# Patient Record
Sex: Male | Born: 1950
Health system: Southern US, Community
[De-identification: ages and names within clinical notes are randomized; demographics above are authoritative.]

---

## 2003-09-08 ENCOUNTER — Ambulatory Visit (HOSPITAL_COMMUNITY): Admission: RE | Admit: 2003-09-08 | Discharge: 2003-09-11 | Payer: Self-pay | Admitting: *Deleted

## 2004-03-05 ENCOUNTER — Ambulatory Visit: Payer: Self-pay | Admitting: Family Medicine

## 2004-03-12 ENCOUNTER — Ambulatory Visit: Payer: Self-pay | Admitting: Family Medicine

## 2004-09-02 ENCOUNTER — Ambulatory Visit: Payer: Self-pay | Admitting: Family Medicine

## 2004-09-05 ENCOUNTER — Ambulatory Visit: Payer: Self-pay | Admitting: Family Medicine

## 2004-12-12 ENCOUNTER — Ambulatory Visit: Payer: Self-pay | Admitting: Family Medicine

## 2005-01-03 ENCOUNTER — Ambulatory Visit: Payer: Self-pay | Admitting: Family Medicine

## 2005-03-03 ENCOUNTER — Ambulatory Visit: Payer: Self-pay | Admitting: Family Medicine

## 2005-07-10 ENCOUNTER — Ambulatory Visit: Payer: Self-pay | Admitting: Family Medicine

## 2005-07-14 IMAGING — CR DG CHEST 2V
2 series · 2 of 2 positions shown · non-contrast
Comparison: none

CLINICAL DATA: Pre-cardiac cath.
 CHEST 
 Two views of the chest show the lungs to be clear.  There is mild cardiomegaly present.  No bony abnormality is seen.
 IMPRESSION
 Mild cardiomegaly.  No active lung disease.

[view not recorded (1 of 2)]
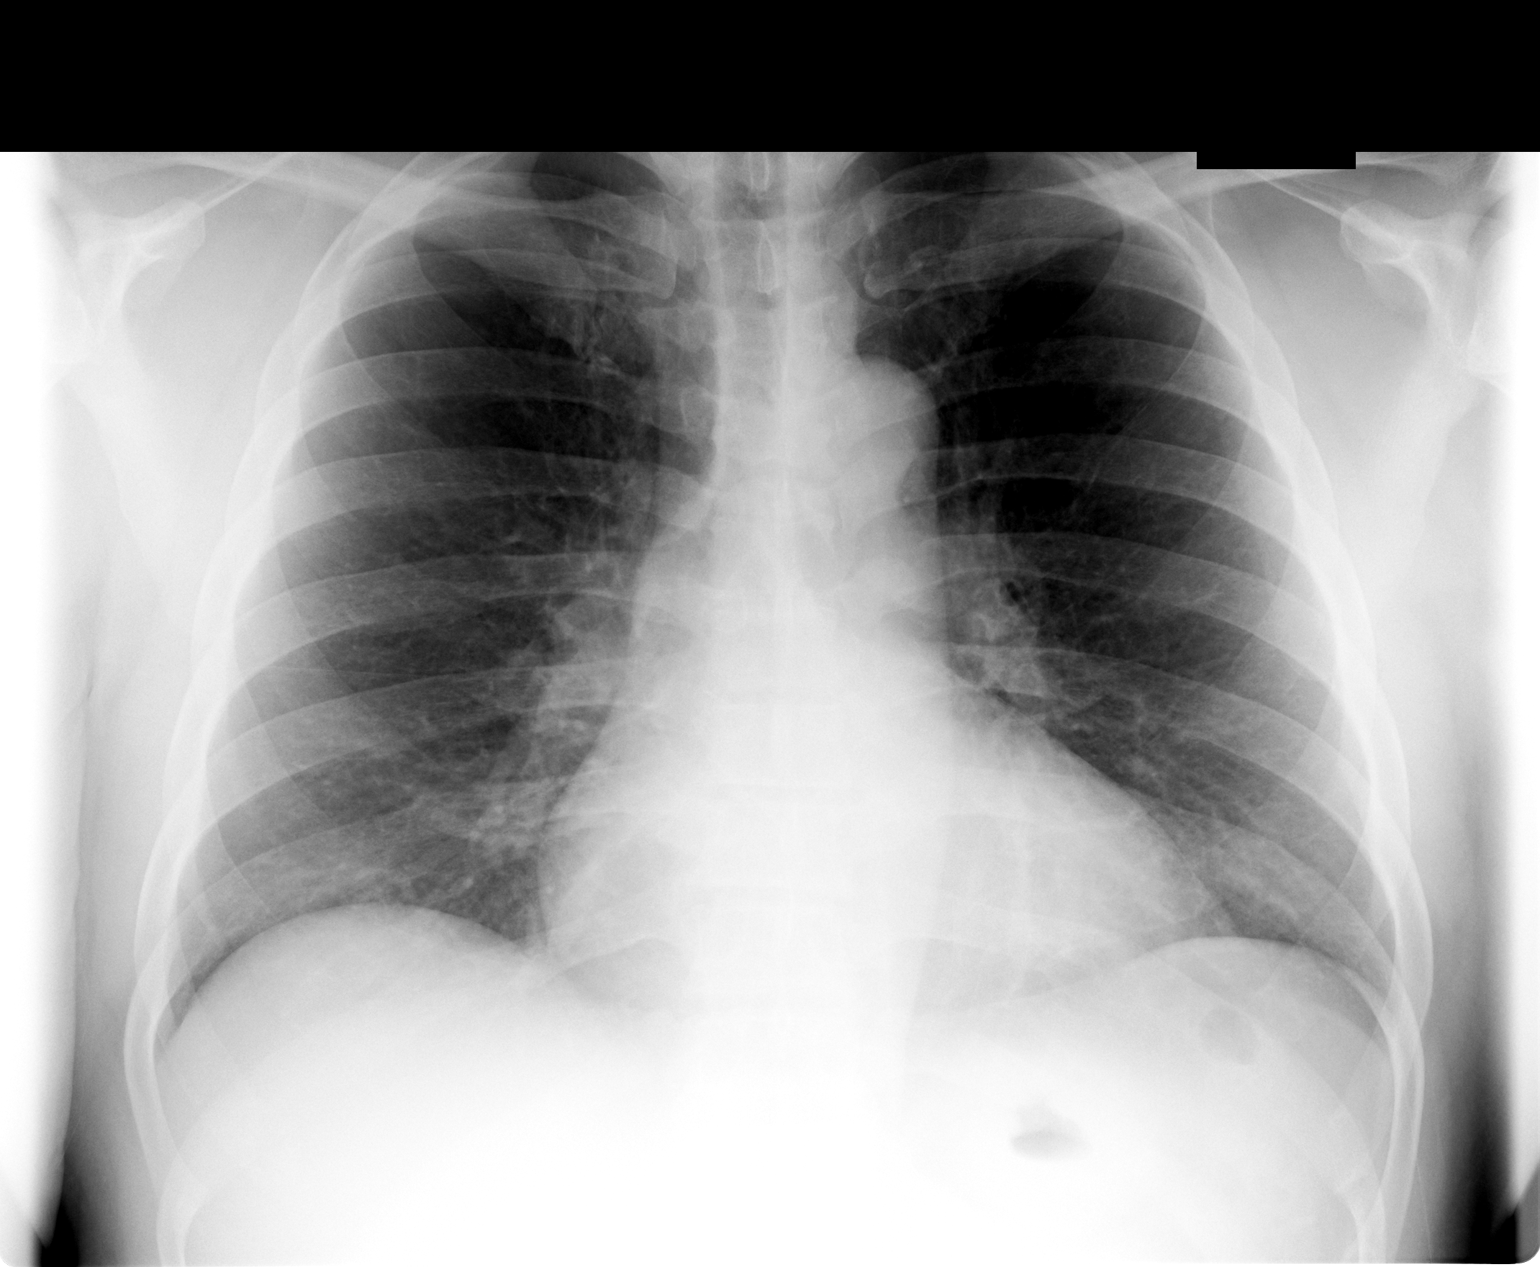

[view not recorded (2 of 2)]
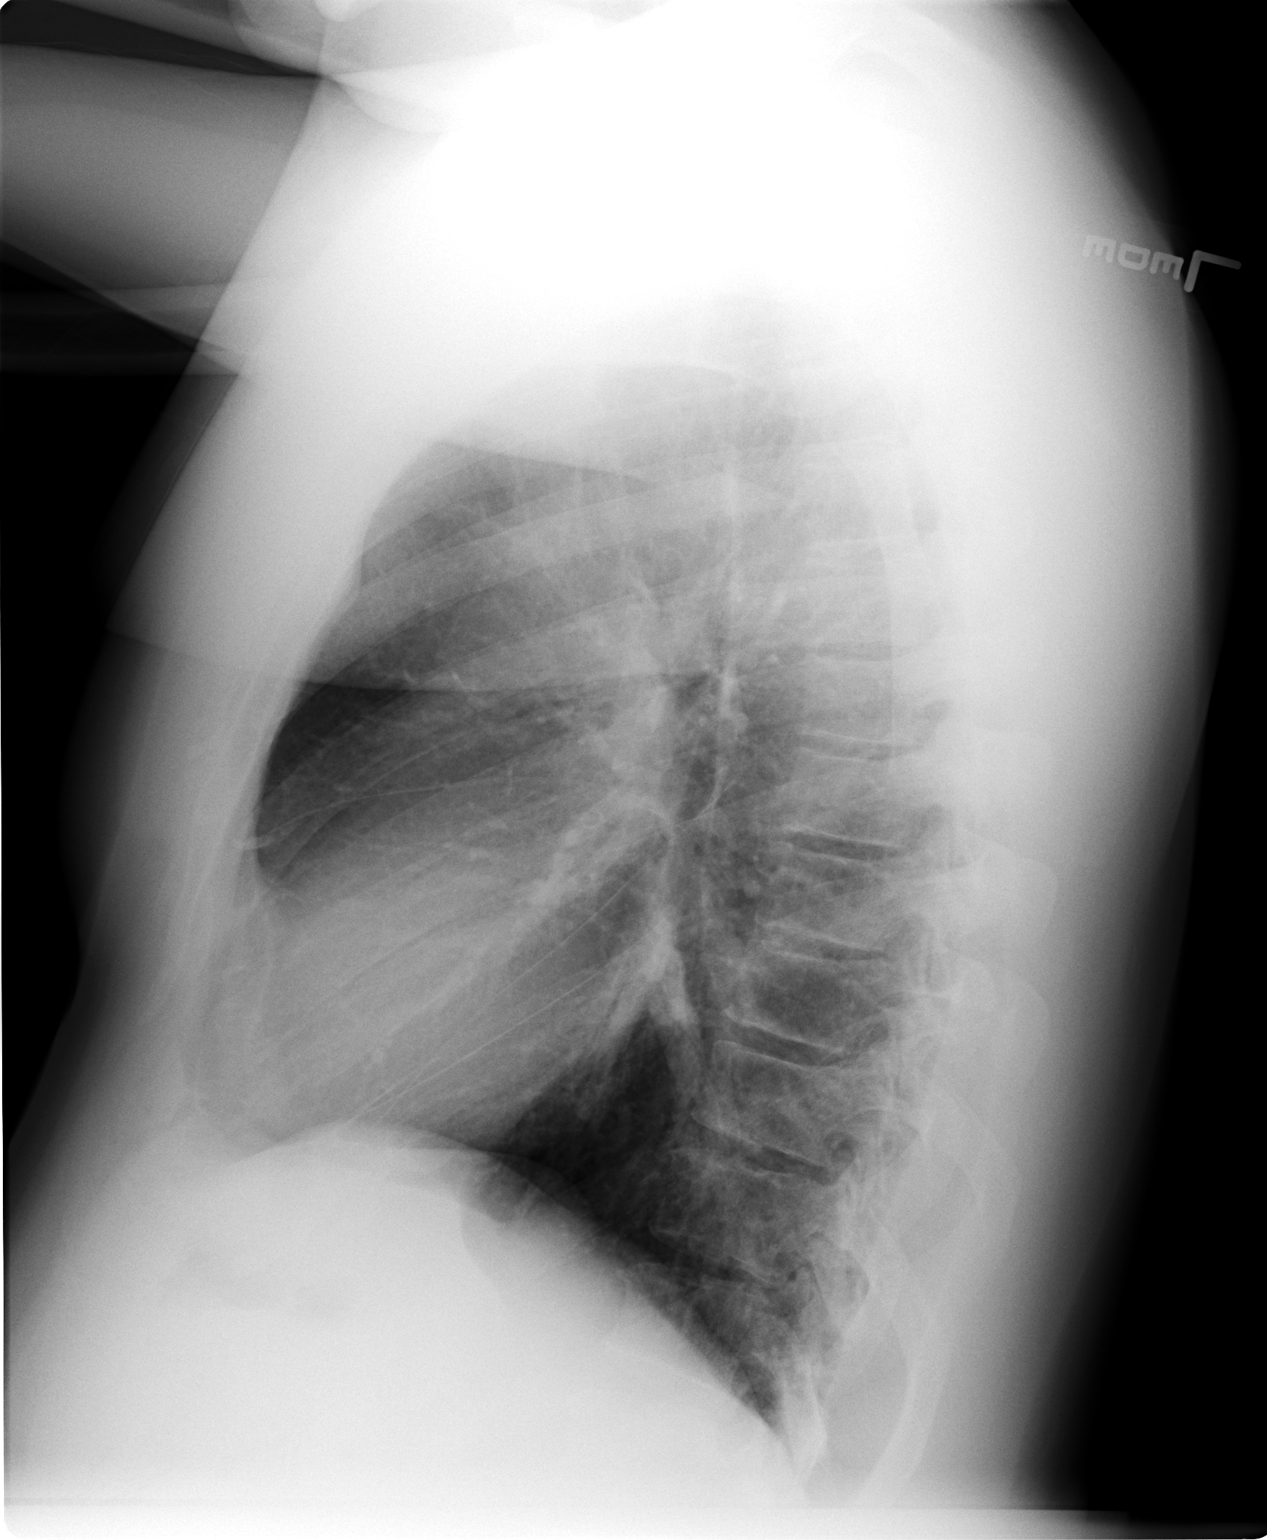

[2 of 2 positions shown; findings below may reference images not displayed]

## 2015-05-11 DIAGNOSIS — I70202 Unspecified atherosclerosis of native arteries of extremities, left leg: Secondary | ICD-10-CM | POA: Diagnosis not present

## 2015-05-18 DIAGNOSIS — E1159 Type 2 diabetes mellitus with other circulatory complications: Secondary | ICD-10-CM | POA: Diagnosis not present

## 2015-05-18 DIAGNOSIS — I739 Peripheral vascular disease, unspecified: Secondary | ICD-10-CM | POA: Diagnosis not present

## 2015-05-18 DIAGNOSIS — I70202 Unspecified atherosclerosis of native arteries of extremities, left leg: Secondary | ICD-10-CM | POA: Diagnosis not present

## 2015-06-08 DIAGNOSIS — E291 Testicular hypofunction: Secondary | ICD-10-CM | POA: Diagnosis not present

## 2015-06-08 DIAGNOSIS — Z125 Encounter for screening for malignant neoplasm of prostate: Secondary | ICD-10-CM | POA: Diagnosis not present

## 2015-06-15 DIAGNOSIS — E291 Testicular hypofunction: Secondary | ICD-10-CM | POA: Diagnosis not present

## 2015-09-26 DIAGNOSIS — R7989 Other specified abnormal findings of blood chemistry: Secondary | ICD-10-CM | POA: Diagnosis not present

## 2015-09-26 DIAGNOSIS — E1159 Type 2 diabetes mellitus with other circulatory complications: Secondary | ICD-10-CM | POA: Diagnosis not present

## 2015-09-26 DIAGNOSIS — E785 Hyperlipidemia, unspecified: Secondary | ICD-10-CM | POA: Diagnosis not present

## 2015-10-09 DIAGNOSIS — I10 Essential (primary) hypertension: Secondary | ICD-10-CM | POA: Diagnosis not present

## 2015-10-09 DIAGNOSIS — K210 Gastro-esophageal reflux disease with esophagitis: Secondary | ICD-10-CM | POA: Diagnosis not present

## 2015-10-09 DIAGNOSIS — N529 Male erectile dysfunction, unspecified: Secondary | ICD-10-CM | POA: Diagnosis not present

## 2015-10-09 DIAGNOSIS — R945 Abnormal results of liver function studies: Secondary | ICD-10-CM | POA: Diagnosis not present

## 2015-10-09 DIAGNOSIS — E1159 Type 2 diabetes mellitus with other circulatory complications: Secondary | ICD-10-CM | POA: Diagnosis not present

## 2015-10-09 DIAGNOSIS — E785 Hyperlipidemia, unspecified: Secondary | ICD-10-CM | POA: Diagnosis not present

## 2015-12-03 DIAGNOSIS — M7751 Other enthesopathy of right foot: Secondary | ICD-10-CM | POA: Diagnosis not present

## 2016-02-18 DIAGNOSIS — E1142 Type 2 diabetes mellitus with diabetic polyneuropathy: Secondary | ICD-10-CM | POA: Diagnosis not present

## 2016-02-18 DIAGNOSIS — M2040 Other hammer toe(s) (acquired), unspecified foot: Secondary | ICD-10-CM | POA: Diagnosis not present

## 2016-02-20 DIAGNOSIS — Z23 Encounter for immunization: Secondary | ICD-10-CM | POA: Diagnosis not present

## 2016-03-04 DIAGNOSIS — H31011 Macula scars of posterior pole (postinflammatory) (post-traumatic), right eye: Secondary | ICD-10-CM | POA: Diagnosis not present

## 2016-03-04 DIAGNOSIS — H524 Presbyopia: Secondary | ICD-10-CM | POA: Diagnosis not present

## 2016-03-04 DIAGNOSIS — H52203 Unspecified astigmatism, bilateral: Secondary | ICD-10-CM | POA: Diagnosis not present

## 2016-03-04 DIAGNOSIS — H43813 Vitreous degeneration, bilateral: Secondary | ICD-10-CM | POA: Diagnosis not present

## 2016-03-04 DIAGNOSIS — H2513 Age-related nuclear cataract, bilateral: Secondary | ICD-10-CM | POA: Diagnosis not present

## 2016-03-04 DIAGNOSIS — E119 Type 2 diabetes mellitus without complications: Secondary | ICD-10-CM | POA: Diagnosis not present

## 2016-03-04 DIAGNOSIS — H5203 Hypermetropia, bilateral: Secondary | ICD-10-CM | POA: Diagnosis not present

## 2016-03-14 DIAGNOSIS — R55 Syncope and collapse: Secondary | ICD-10-CM | POA: Diagnosis not present

## 2016-03-14 DIAGNOSIS — R404 Transient alteration of awareness: Secondary | ICD-10-CM | POA: Diagnosis not present

## 2016-03-14 DIAGNOSIS — Z7289 Other problems related to lifestyle: Secondary | ICD-10-CM | POA: Diagnosis not present

## 2016-03-14 DIAGNOSIS — E119 Type 2 diabetes mellitus without complications: Secondary | ICD-10-CM | POA: Diagnosis not present

## 2016-03-14 DIAGNOSIS — I10 Essential (primary) hypertension: Secondary | ICD-10-CM | POA: Diagnosis not present

## 2016-03-14 DIAGNOSIS — W19XXXA Unspecified fall, initial encounter: Secondary | ICD-10-CM | POA: Diagnosis not present

## 2016-03-14 DIAGNOSIS — R61 Generalized hyperhidrosis: Secondary | ICD-10-CM | POA: Diagnosis not present

## 2016-03-14 DIAGNOSIS — S00531A Contusion of lip, initial encounter: Secondary | ICD-10-CM | POA: Diagnosis not present

## 2016-03-14 DIAGNOSIS — R531 Weakness: Secondary | ICD-10-CM | POA: Diagnosis not present

## 2016-03-14 DIAGNOSIS — R6883 Chills (without fever): Secondary | ICD-10-CM | POA: Diagnosis not present

## 2016-03-24 DIAGNOSIS — E291 Testicular hypofunction: Secondary | ICD-10-CM | POA: Diagnosis not present

## 2016-03-24 DIAGNOSIS — N400 Enlarged prostate without lower urinary tract symptoms: Secondary | ICD-10-CM | POA: Diagnosis not present

## 2016-03-24 DIAGNOSIS — E349 Endocrine disorder, unspecified: Secondary | ICD-10-CM | POA: Diagnosis not present

## 2016-04-10 DIAGNOSIS — E1151 Type 2 diabetes mellitus with diabetic peripheral angiopathy without gangrene: Secondary | ICD-10-CM | POA: Diagnosis not present

## 2016-04-10 DIAGNOSIS — E1159 Type 2 diabetes mellitus with other circulatory complications: Secondary | ICD-10-CM | POA: Diagnosis not present

## 2016-04-10 DIAGNOSIS — E7801 Familial hypercholesterolemia: Secondary | ICD-10-CM | POA: Diagnosis not present

## 2016-04-10 DIAGNOSIS — I70219 Atherosclerosis of native arteries of extremities with intermittent claudication, unspecified extremity: Secondary | ICD-10-CM | POA: Diagnosis not present

## 2016-04-10 DIAGNOSIS — Z0000 Encounter for general adult medical examination without abnormal findings: Secondary | ICD-10-CM | POA: Diagnosis not present

## 2016-04-10 DIAGNOSIS — I10 Essential (primary) hypertension: Secondary | ICD-10-CM | POA: Diagnosis not present

## 2016-04-10 DIAGNOSIS — K210 Gastro-esophageal reflux disease with esophagitis: Secondary | ICD-10-CM | POA: Diagnosis not present

## 2016-04-10 DIAGNOSIS — N529 Male erectile dysfunction, unspecified: Secondary | ICD-10-CM | POA: Diagnosis not present

## 2016-04-22 DIAGNOSIS — N400 Enlarged prostate without lower urinary tract symptoms: Secondary | ICD-10-CM | POA: Diagnosis not present

## 2016-04-22 DIAGNOSIS — E291 Testicular hypofunction: Secondary | ICD-10-CM | POA: Diagnosis not present

## 2016-04-30 DIAGNOSIS — N400 Enlarged prostate without lower urinary tract symptoms: Secondary | ICD-10-CM | POA: Diagnosis not present

## 2016-05-08 DIAGNOSIS — I70202 Unspecified atherosclerosis of native arteries of extremities, left leg: Secondary | ICD-10-CM | POA: Diagnosis not present

## 2016-05-28 DIAGNOSIS — I10 Essential (primary) hypertension: Secondary | ICD-10-CM | POA: Diagnosis not present

## 2016-05-28 DIAGNOSIS — I70219 Atherosclerosis of native arteries of extremities with intermittent claudication, unspecified extremity: Secondary | ICD-10-CM | POA: Diagnosis not present

## 2016-05-28 DIAGNOSIS — E1159 Type 2 diabetes mellitus with other circulatory complications: Secondary | ICD-10-CM | POA: Diagnosis not present

## 2016-10-15 DIAGNOSIS — I10 Essential (primary) hypertension: Secondary | ICD-10-CM | POA: Diagnosis not present

## 2016-10-15 DIAGNOSIS — E7801 Familial hypercholesterolemia: Secondary | ICD-10-CM | POA: Diagnosis not present

## 2016-10-15 DIAGNOSIS — E1159 Type 2 diabetes mellitus with other circulatory complications: Secondary | ICD-10-CM | POA: Diagnosis not present

## 2016-10-21 DIAGNOSIS — E291 Testicular hypofunction: Secondary | ICD-10-CM | POA: Diagnosis not present

## 2016-10-21 DIAGNOSIS — N400 Enlarged prostate without lower urinary tract symptoms: Secondary | ICD-10-CM | POA: Diagnosis not present

## 2016-10-22 DIAGNOSIS — J3089 Other allergic rhinitis: Secondary | ICD-10-CM | POA: Diagnosis not present

## 2016-10-22 DIAGNOSIS — E7801 Familial hypercholesterolemia: Secondary | ICD-10-CM | POA: Diagnosis not present

## 2016-10-22 DIAGNOSIS — K210 Gastro-esophageal reflux disease with esophagitis: Secondary | ICD-10-CM | POA: Diagnosis not present

## 2016-10-22 DIAGNOSIS — R531 Weakness: Secondary | ICD-10-CM | POA: Diagnosis not present

## 2016-10-22 DIAGNOSIS — N529 Male erectile dysfunction, unspecified: Secondary | ICD-10-CM | POA: Diagnosis not present

## 2016-10-22 DIAGNOSIS — E1159 Type 2 diabetes mellitus with other circulatory complications: Secondary | ICD-10-CM | POA: Diagnosis not present

## 2016-10-22 DIAGNOSIS — I10 Essential (primary) hypertension: Secondary | ICD-10-CM | POA: Diagnosis not present

## 2016-10-22 DIAGNOSIS — I739 Peripheral vascular disease, unspecified: Secondary | ICD-10-CM | POA: Diagnosis not present

## 2016-10-28 DIAGNOSIS — E291 Testicular hypofunction: Secondary | ICD-10-CM | POA: Diagnosis not present

## 2016-11-07 DIAGNOSIS — R7989 Other specified abnormal findings of blood chemistry: Secondary | ICD-10-CM | POA: Diagnosis not present

## 2016-11-07 DIAGNOSIS — R748 Abnormal levels of other serum enzymes: Secondary | ICD-10-CM | POA: Diagnosis not present

## 2016-11-10 DIAGNOSIS — R7989 Other specified abnormal findings of blood chemistry: Secondary | ICD-10-CM | POA: Diagnosis not present

## 2016-11-10 DIAGNOSIS — R748 Abnormal levels of other serum enzymes: Secondary | ICD-10-CM | POA: Diagnosis not present

## 2016-11-10 DIAGNOSIS — E872 Acidosis: Secondary | ICD-10-CM | POA: Diagnosis not present

## 2016-11-10 DIAGNOSIS — R531 Weakness: Secondary | ICD-10-CM | POA: Diagnosis not present

## 2016-11-13 DIAGNOSIS — E291 Testicular hypofunction: Secondary | ICD-10-CM | POA: Diagnosis not present

## 2016-11-27 DIAGNOSIS — N179 Acute kidney failure, unspecified: Secondary | ICD-10-CM | POA: Diagnosis not present

## 2016-11-27 DIAGNOSIS — R748 Abnormal levels of other serum enzymes: Secondary | ICD-10-CM | POA: Diagnosis not present

## 2016-11-27 DIAGNOSIS — R531 Weakness: Secondary | ICD-10-CM | POA: Diagnosis not present

## 2016-11-27 DIAGNOSIS — R7989 Other specified abnormal findings of blood chemistry: Secondary | ICD-10-CM | POA: Diagnosis not present

## 2016-12-09 DIAGNOSIS — R748 Abnormal levels of other serum enzymes: Secondary | ICD-10-CM | POA: Diagnosis not present

## 2016-12-09 DIAGNOSIS — N179 Acute kidney failure, unspecified: Secondary | ICD-10-CM | POA: Diagnosis not present

## 2016-12-11 DIAGNOSIS — N179 Acute kidney failure, unspecified: Secondary | ICD-10-CM | POA: Diagnosis not present

## 2016-12-11 DIAGNOSIS — E871 Hypo-osmolality and hyponatremia: Secondary | ICD-10-CM | POA: Diagnosis not present

## 2016-12-11 DIAGNOSIS — E559 Vitamin D deficiency, unspecified: Secondary | ICD-10-CM | POA: Diagnosis not present

## 2016-12-11 DIAGNOSIS — R748 Abnormal levels of other serum enzymes: Secondary | ICD-10-CM | POA: Diagnosis not present

## 2017-01-14 DIAGNOSIS — E291 Testicular hypofunction: Secondary | ICD-10-CM | POA: Diagnosis not present

## 2017-01-15 DIAGNOSIS — N179 Acute kidney failure, unspecified: Secondary | ICD-10-CM | POA: Diagnosis not present

## 2017-01-19 DIAGNOSIS — E889 Metabolic disorder, unspecified: Secondary | ICD-10-CM | POA: Diagnosis not present

## 2017-01-19 DIAGNOSIS — I10 Essential (primary) hypertension: Secondary | ICD-10-CM | POA: Diagnosis not present

## 2017-01-19 DIAGNOSIS — E1159 Type 2 diabetes mellitus with other circulatory complications: Secondary | ICD-10-CM | POA: Diagnosis not present

## 2017-01-19 DIAGNOSIS — R748 Abnormal levels of other serum enzymes: Secondary | ICD-10-CM | POA: Diagnosis not present

## 2017-01-21 DIAGNOSIS — E291 Testicular hypofunction: Secondary | ICD-10-CM | POA: Diagnosis not present

## 2017-01-21 DIAGNOSIS — N400 Enlarged prostate without lower urinary tract symptoms: Secondary | ICD-10-CM | POA: Diagnosis not present

## 2017-01-22 DIAGNOSIS — R7989 Other specified abnormal findings of blood chemistry: Secondary | ICD-10-CM | POA: Diagnosis not present

## 2017-02-25 DIAGNOSIS — Z23 Encounter for immunization: Secondary | ICD-10-CM | POA: Diagnosis not present

## 2017-02-27 DIAGNOSIS — E1159 Type 2 diabetes mellitus with other circulatory complications: Secondary | ICD-10-CM | POA: Diagnosis not present

## 2017-03-02 DIAGNOSIS — E889 Metabolic disorder, unspecified: Secondary | ICD-10-CM | POA: Diagnosis not present

## 2017-03-02 DIAGNOSIS — M6281 Muscle weakness (generalized): Secondary | ICD-10-CM | POA: Diagnosis not present

## 2017-03-02 DIAGNOSIS — I10 Essential (primary) hypertension: Secondary | ICD-10-CM | POA: Diagnosis not present

## 2017-03-02 DIAGNOSIS — E1159 Type 2 diabetes mellitus with other circulatory complications: Secondary | ICD-10-CM | POA: Diagnosis not present

## 2017-03-04 DIAGNOSIS — M7751 Other enthesopathy of right foot: Secondary | ICD-10-CM | POA: Diagnosis not present

## 2017-03-04 DIAGNOSIS — E119 Type 2 diabetes mellitus without complications: Secondary | ICD-10-CM | POA: Diagnosis not present

## 2017-03-04 DIAGNOSIS — E1142 Type 2 diabetes mellitus with diabetic polyneuropathy: Secondary | ICD-10-CM | POA: Diagnosis not present

## 2017-03-13 DIAGNOSIS — H2513 Age-related nuclear cataract, bilateral: Secondary | ICD-10-CM | POA: Diagnosis not present

## 2017-03-13 DIAGNOSIS — E119 Type 2 diabetes mellitus without complications: Secondary | ICD-10-CM | POA: Diagnosis not present

## 2017-03-13 DIAGNOSIS — H31011 Macula scars of posterior pole (postinflammatory) (post-traumatic), right eye: Secondary | ICD-10-CM | POA: Diagnosis not present

## 2017-03-13 DIAGNOSIS — H43813 Vitreous degeneration, bilateral: Secondary | ICD-10-CM | POA: Diagnosis not present

## 2017-03-25 DIAGNOSIS — E291 Testicular hypofunction: Secondary | ICD-10-CM | POA: Diagnosis not present

## 2017-03-30 DIAGNOSIS — E1142 Type 2 diabetes mellitus with diabetic polyneuropathy: Secondary | ICD-10-CM | POA: Diagnosis not present

## 2017-04-10 DIAGNOSIS — M6281 Muscle weakness (generalized): Secondary | ICD-10-CM | POA: Diagnosis not present

## 2017-04-10 DIAGNOSIS — R748 Abnormal levels of other serum enzymes: Secondary | ICD-10-CM | POA: Diagnosis not present

## 2017-04-10 DIAGNOSIS — G729 Myopathy, unspecified: Secondary | ICD-10-CM | POA: Diagnosis not present

## 2017-04-16 DIAGNOSIS — K210 Gastro-esophageal reflux disease with esophagitis: Secondary | ICD-10-CM | POA: Diagnosis not present

## 2017-04-16 DIAGNOSIS — I739 Peripheral vascular disease, unspecified: Secondary | ICD-10-CM | POA: Diagnosis not present

## 2017-04-16 DIAGNOSIS — Z0000 Encounter for general adult medical examination without abnormal findings: Secondary | ICD-10-CM | POA: Diagnosis not present

## 2017-04-16 DIAGNOSIS — E7801 Familial hypercholesterolemia: Secondary | ICD-10-CM | POA: Diagnosis not present

## 2017-04-16 DIAGNOSIS — I10 Essential (primary) hypertension: Secondary | ICD-10-CM | POA: Diagnosis not present

## 2017-04-16 DIAGNOSIS — E1159 Type 2 diabetes mellitus with other circulatory complications: Secondary | ICD-10-CM | POA: Diagnosis not present

## 2017-04-16 DIAGNOSIS — E291 Testicular hypofunction: Secondary | ICD-10-CM | POA: Diagnosis not present

## 2017-04-16 DIAGNOSIS — Z23 Encounter for immunization: Secondary | ICD-10-CM | POA: Diagnosis not present

## 2017-04-16 DIAGNOSIS — R531 Weakness: Secondary | ICD-10-CM | POA: Diagnosis not present

## 2017-04-23 DIAGNOSIS — R9431 Abnormal electrocardiogram [ECG] [EKG]: Secondary | ICD-10-CM | POA: Diagnosis not present

## 2017-04-23 DIAGNOSIS — G5692 Unspecified mononeuropathy of left upper limb: Secondary | ICD-10-CM | POA: Diagnosis not present

## 2017-04-23 DIAGNOSIS — G729 Myopathy, unspecified: Secondary | ICD-10-CM | POA: Diagnosis not present

## 2017-05-06 DIAGNOSIS — R9431 Abnormal electrocardiogram [ECG] [EKG]: Secondary | ICD-10-CM | POA: Diagnosis not present

## 2017-05-13 DIAGNOSIS — I743 Embolism and thrombosis of arteries of the lower extremities: Secondary | ICD-10-CM | POA: Diagnosis not present

## 2017-05-13 DIAGNOSIS — I739 Peripheral vascular disease, unspecified: Secondary | ICD-10-CM | POA: Diagnosis not present

## 2017-05-13 DIAGNOSIS — I998 Other disorder of circulatory system: Secondary | ICD-10-CM | POA: Diagnosis not present

## 2017-05-22 DIAGNOSIS — G729 Myopathy, unspecified: Secondary | ICD-10-CM | POA: Diagnosis not present

## 2017-05-22 DIAGNOSIS — M6281 Muscle weakness (generalized): Secondary | ICD-10-CM | POA: Diagnosis not present

## 2017-05-26 DIAGNOSIS — E291 Testicular hypofunction: Secondary | ICD-10-CM | POA: Diagnosis not present

## 2017-06-02 DIAGNOSIS — N138 Other obstructive and reflux uropathy: Secondary | ICD-10-CM | POA: Diagnosis not present

## 2017-06-02 DIAGNOSIS — N401 Enlarged prostate with lower urinary tract symptoms: Secondary | ICD-10-CM | POA: Diagnosis not present

## 2017-06-02 DIAGNOSIS — E291 Testicular hypofunction: Secondary | ICD-10-CM | POA: Diagnosis not present

## 2017-07-03 DIAGNOSIS — N529 Male erectile dysfunction, unspecified: Secondary | ICD-10-CM | POA: Diagnosis not present

## 2017-07-03 DIAGNOSIS — E291 Testicular hypofunction: Secondary | ICD-10-CM | POA: Diagnosis not present

## 2017-07-03 DIAGNOSIS — N138 Other obstructive and reflux uropathy: Secondary | ICD-10-CM | POA: Diagnosis not present

## 2017-07-03 DIAGNOSIS — N401 Enlarged prostate with lower urinary tract symptoms: Secondary | ICD-10-CM | POA: Diagnosis not present

## 2017-07-08 DIAGNOSIS — R29898 Other symptoms and signs involving the musculoskeletal system: Secondary | ICD-10-CM | POA: Diagnosis not present

## 2017-07-08 DIAGNOSIS — R748 Abnormal levels of other serum enzymes: Secondary | ICD-10-CM | POA: Diagnosis not present

## 2017-07-08 DIAGNOSIS — G729 Myopathy, unspecified: Secondary | ICD-10-CM | POA: Diagnosis not present

## 2017-07-08 DIAGNOSIS — G629 Polyneuropathy, unspecified: Secondary | ICD-10-CM | POA: Diagnosis not present

## 2017-07-10 DIAGNOSIS — G479 Sleep disorder, unspecified: Secondary | ICD-10-CM | POA: Diagnosis not present

## 2017-07-10 DIAGNOSIS — E1159 Type 2 diabetes mellitus with other circulatory complications: Secondary | ICD-10-CM | POA: Diagnosis not present

## 2017-07-10 DIAGNOSIS — R042 Hemoptysis: Secondary | ICD-10-CM | POA: Diagnosis not present

## 2017-07-10 DIAGNOSIS — R531 Weakness: Secondary | ICD-10-CM | POA: Diagnosis not present

## 2017-07-11 DIAGNOSIS — E1100 Type 2 diabetes mellitus with hyperosmolarity without nonketotic hyperglycemic-hyperosmolar coma (NKHHC): Secondary | ICD-10-CM | POA: Diagnosis not present

## 2017-07-11 DIAGNOSIS — D72829 Elevated white blood cell count, unspecified: Secondary | ICD-10-CM | POA: Diagnosis not present

## 2017-07-11 DIAGNOSIS — N400 Enlarged prostate without lower urinary tract symptoms: Secondary | ICD-10-CM | POA: Diagnosis not present

## 2017-07-11 DIAGNOSIS — I9589 Other hypotension: Secondary | ICD-10-CM | POA: Diagnosis not present

## 2017-07-11 DIAGNOSIS — J168 Pneumonia due to other specified infectious organisms: Secondary | ICD-10-CM | POA: Diagnosis not present

## 2017-07-11 DIAGNOSIS — R634 Abnormal weight loss: Secondary | ICD-10-CM | POA: Diagnosis not present

## 2017-07-11 DIAGNOSIS — J181 Lobar pneumonia, unspecified organism: Secondary | ICD-10-CM | POA: Diagnosis not present

## 2017-07-11 DIAGNOSIS — I517 Cardiomegaly: Secondary | ICD-10-CM | POA: Diagnosis not present

## 2017-07-11 DIAGNOSIS — Z9981 Dependence on supplemental oxygen: Secondary | ICD-10-CM | POA: Diagnosis not present

## 2017-07-11 DIAGNOSIS — R5383 Other fatigue: Secondary | ICD-10-CM | POA: Diagnosis not present

## 2017-07-11 DIAGNOSIS — R197 Diarrhea, unspecified: Secondary | ICD-10-CM | POA: Diagnosis not present

## 2017-07-11 DIAGNOSIS — J189 Pneumonia, unspecified organism: Secondary | ICD-10-CM | POA: Diagnosis not present

## 2017-07-11 DIAGNOSIS — R630 Anorexia: Secondary | ICD-10-CM | POA: Diagnosis not present

## 2017-07-11 DIAGNOSIS — R000 Tachycardia, unspecified: Secondary | ICD-10-CM | POA: Diagnosis not present

## 2017-07-11 DIAGNOSIS — J9601 Acute respiratory failure with hypoxia: Secondary | ICD-10-CM | POA: Diagnosis not present

## 2017-07-11 DIAGNOSIS — R591 Generalized enlarged lymph nodes: Secondary | ICD-10-CM | POA: Diagnosis not present

## 2017-07-11 DIAGNOSIS — I700 Atherosclerosis of aorta: Secondary | ICD-10-CM | POA: Diagnosis not present

## 2017-07-11 DIAGNOSIS — R05 Cough: Secondary | ICD-10-CM | POA: Diagnosis not present

## 2017-07-11 DIAGNOSIS — E1165 Type 2 diabetes mellitus with hyperglycemia: Secondary | ICD-10-CM | POA: Diagnosis not present

## 2017-07-11 DIAGNOSIS — I361 Nonrheumatic tricuspid (valve) insufficiency: Secondary | ICD-10-CM | POA: Diagnosis not present

## 2017-07-11 DIAGNOSIS — J439 Emphysema, unspecified: Secondary | ICD-10-CM | POA: Diagnosis not present

## 2017-07-11 DIAGNOSIS — Z800 Family history of malignant neoplasm of digestive organs: Secondary | ICD-10-CM | POA: Diagnosis not present

## 2017-07-11 DIAGNOSIS — Z7982 Long term (current) use of aspirin: Secondary | ICD-10-CM | POA: Diagnosis not present

## 2017-07-11 DIAGNOSIS — R531 Weakness: Secondary | ICD-10-CM | POA: Diagnosis not present

## 2017-07-11 DIAGNOSIS — J9600 Acute respiratory failure, unspecified whether with hypoxia or hypercapnia: Secondary | ICD-10-CM | POA: Diagnosis not present

## 2017-07-11 DIAGNOSIS — Z79899 Other long term (current) drug therapy: Secondary | ICD-10-CM | POA: Diagnosis not present

## 2017-07-11 DIAGNOSIS — I491 Atrial premature depolarization: Secondary | ICD-10-CM | POA: Diagnosis not present

## 2017-07-11 DIAGNOSIS — R222 Localized swelling, mass and lump, trunk: Secondary | ICD-10-CM | POA: Diagnosis not present

## 2017-07-11 DIAGNOSIS — J9690 Respiratory failure, unspecified, unspecified whether with hypoxia or hypercapnia: Secondary | ICD-10-CM | POA: Diagnosis not present

## 2017-07-11 DIAGNOSIS — G7111 Myotonic muscular dystrophy: Secondary | ICD-10-CM | POA: Diagnosis not present

## 2017-07-11 DIAGNOSIS — R5381 Other malaise: Secondary | ICD-10-CM | POA: Diagnosis not present

## 2017-07-11 DIAGNOSIS — E871 Hypo-osmolality and hyponatremia: Secondary | ICD-10-CM | POA: Diagnosis not present

## 2017-07-11 DIAGNOSIS — Z794 Long term (current) use of insulin: Secondary | ICD-10-CM | POA: Diagnosis not present

## 2017-07-11 DIAGNOSIS — J850 Gangrene and necrosis of lung: Secondary | ICD-10-CM | POA: Diagnosis not present

## 2017-07-11 DIAGNOSIS — E1159 Type 2 diabetes mellitus with other circulatory complications: Secondary | ICD-10-CM | POA: Diagnosis not present

## 2017-07-11 DIAGNOSIS — A419 Sepsis, unspecified organism: Secondary | ICD-10-CM | POA: Diagnosis not present

## 2017-07-11 DIAGNOSIS — Z9911 Dependence on respirator [ventilator] status: Secondary | ICD-10-CM | POA: Diagnosis not present

## 2017-07-11 DIAGNOSIS — G729 Myopathy, unspecified: Secondary | ICD-10-CM | POA: Diagnosis not present

## 2017-07-11 DIAGNOSIS — A4189 Other specified sepsis: Secondary | ICD-10-CM | POA: Diagnosis not present

## 2017-07-11 DIAGNOSIS — I10 Essential (primary) hypertension: Secondary | ICD-10-CM | POA: Diagnosis not present

## 2017-07-11 DIAGNOSIS — R079 Chest pain, unspecified: Secondary | ICD-10-CM | POA: Diagnosis not present

## 2017-07-11 DIAGNOSIS — R0602 Shortness of breath: Secondary | ICD-10-CM | POA: Diagnosis not present

## 2017-07-11 DIAGNOSIS — E1101 Type 2 diabetes mellitus with hyperosmolarity with coma: Secondary | ICD-10-CM | POA: Diagnosis not present

## 2017-07-11 DIAGNOSIS — R042 Hemoptysis: Secondary | ICD-10-CM | POA: Diagnosis not present

## 2017-07-11 DIAGNOSIS — R590 Localized enlarged lymph nodes: Secondary | ICD-10-CM | POA: Diagnosis not present

## 2017-07-11 DIAGNOSIS — I2510 Atherosclerotic heart disease of native coronary artery without angina pectoris: Secondary | ICD-10-CM | POA: Diagnosis not present

## 2017-07-11 DIAGNOSIS — E1144 Type 2 diabetes mellitus with diabetic amyotrophy: Secondary | ICD-10-CM | POA: Diagnosis not present

## 2017-07-14 DIAGNOSIS — J189 Pneumonia, unspecified organism: Secondary | ICD-10-CM | POA: Diagnosis not present

## 2017-07-27 ENCOUNTER — Other Ambulatory Visit: Payer: Self-pay | Admitting: *Deleted

## 2017-07-27 NOTE — Patient Outreach (Signed)
[facility]) Care Management  07/27/2017  Lenis Nooncey G Mcginnis 1950/10/11 [id number]  Referral received from Mr. Benish's health plan for transition of care services after his recent admission to [facility] ending on 07/24/17 for treatment of pneumonia. I spoke with Mr. Bamberg by phone to review details of his hospitalization and to assess for any transition of care needs. I explained [facility] Care Management services to [name] and received permission to speak with him about his recent hospital stay. [name] has a good understanding of the reason for his hospitalization "I had pneumonia dn it made my sugar go up too."   [name] has scheduled his post hospital follow up appointment with his primary care provider, Dr. Massie MaroonMichael Haimes on 08/05/17.  He is able to drive himself to his appointment.   [name] states he is feeling much better this week and has been able to walk about his proporty almost daily without worsened shortness of breath or other symptom. He states he has a good appetite and is eating three meals daily. He reports to follow a heart healthy, carb modified diet. [name] reports a fasting cbg of 121 today and says that his cbg's have been under control and consistent with his norm since returning home.   I reviewed medications with [name] and he was able to tell me which medications he is taking as well as those that have been discontinued in the last year because of untoward side effects.   [name] states home health services were not ordered at hospital discharge nor does he feel he is in need of services. I advised [name] of the services offered to [facility] through [facility]. He says he will be grateful for any materials mailed to him so that he might have them in the event that he is in need of services in the future but currently does not feel he needs ongoing follow up. He agreed to allow me to reach out to him after his  post hospital appointment to ensure that no new needs have arisen.   Plan: I will follow up with [name] again after his post hospital PCP appointment.   [facility] CM Care Plan Problem One     Most Recent Value  Care Plan Problem One  Knowledge Deficits related to Plan of care respiratory/pneumonia recovery plan  Role Documenting the Problem One  Care Management Coordinator  Care Plan for Problem One  Active  [facility] Long Term Goal   Over the next 15 days, patient will verbalize understanding of plan of care for recovery/managmeent of pneumonia  [facility] Long Term Goal Start Date  07/27/17  Interventions for Problem One Long Term Goal  Emmi educational materials prescribed,  reviewed hospital discharge plan,  explained benefits offered to aid in recovery plan  [facility] CM Short Term Goal #1   Over the next 7 days, patient will attend scheduled PCP post hospital follow up appointment  [facility] Short Term Goal #1 Start Date  07/27/17  Interventions for Short Term Goal #1  reviewed scheduled provider appointment,  ensured transportation arranged,  discussed questions to ask provider  [facility]al #2   Over the next 14 days, patient will verbalize undertanding of long term plan of care for recovery from pneumonia including signs and symptoms for which he should call provider  [facility] Short Term Goal #2 Start Date  07/27/17  Interventions for Short Term Goal #2  Emmi educational materials prescribed w review of signs and symptoms,        Marja Kays [facility] Care Management  [phone]

## 2017-07-31 DIAGNOSIS — E1151 Type 2 diabetes mellitus with diabetic peripheral angiopathy without gangrene: Secondary | ICD-10-CM | POA: Diagnosis not present

## 2017-07-31 DIAGNOSIS — Z7982 Long term (current) use of aspirin: Secondary | ICD-10-CM | POA: Diagnosis not present

## 2017-07-31 DIAGNOSIS — E559 Vitamin D deficiency, unspecified: Secondary | ICD-10-CM | POA: Diagnosis not present

## 2017-07-31 DIAGNOSIS — N401 Enlarged prostate with lower urinary tract symptoms: Secondary | ICD-10-CM | POA: Diagnosis not present

## 2017-07-31 DIAGNOSIS — H52223 Regular astigmatism, bilateral: Secondary | ICD-10-CM | POA: Diagnosis not present

## 2017-07-31 DIAGNOSIS — I2510 Atherosclerotic heart disease of native coronary artery without angina pectoris: Secondary | ICD-10-CM | POA: Diagnosis not present

## 2017-07-31 DIAGNOSIS — N138 Other obstructive and reflux uropathy: Secondary | ICD-10-CM | POA: Diagnosis not present

## 2017-07-31 DIAGNOSIS — K210 Gastro-esophageal reflux disease with esophagitis: Secondary | ICD-10-CM | POA: Diagnosis not present

## 2017-07-31 DIAGNOSIS — Z7951 Long term (current) use of inhaled steroids: Secondary | ICD-10-CM | POA: Diagnosis not present

## 2017-07-31 DIAGNOSIS — H31001 Unspecified chorioretinal scars, right eye: Secondary | ICD-10-CM | POA: Diagnosis not present

## 2017-07-31 DIAGNOSIS — E785 Hyperlipidemia, unspecified: Secondary | ICD-10-CM | POA: Diagnosis not present

## 2017-07-31 DIAGNOSIS — I10 Essential (primary) hypertension: Secondary | ICD-10-CM | POA: Diagnosis not present

## 2017-07-31 DIAGNOSIS — E1144 Type 2 diabetes mellitus with diabetic amyotrophy: Secondary | ICD-10-CM | POA: Diagnosis not present

## 2017-08-05 DIAGNOSIS — Z8701 Personal history of pneumonia (recurrent): Secondary | ICD-10-CM | POA: Diagnosis not present

## 2017-08-05 DIAGNOSIS — I10 Essential (primary) hypertension: Secondary | ICD-10-CM | POA: Diagnosis not present

## 2017-08-05 DIAGNOSIS — G629 Polyneuropathy, unspecified: Secondary | ICD-10-CM | POA: Diagnosis not present

## 2017-08-05 DIAGNOSIS — E1159 Type 2 diabetes mellitus with other circulatory complications: Secondary | ICD-10-CM | POA: Diagnosis not present

## 2017-08-05 DIAGNOSIS — R29898 Other symptoms and signs involving the musculoskeletal system: Secondary | ICD-10-CM | POA: Diagnosis not present

## 2017-08-05 DIAGNOSIS — Z09 Encounter for follow-up examination after completed treatment for conditions other than malignant neoplasm: Secondary | ICD-10-CM | POA: Diagnosis not present

## 2017-08-06 ENCOUNTER — Other Ambulatory Visit: Payer: Self-pay | Admitting: *Deleted

## 2017-08-06 NOTE — Patient Outreach (Signed)
[facility]) Care Management  08/06/2017  Tim Rios Tim Rios 06/19/1950 [id number]  I was unable to reach [name] when I called today to follow up on his post hospital follow up appointment with his PCP.   Plan: I will reach out again to [name] next week.    Marja Kayslisa Shaylie Eklund MHA,BSN,RN,CCM [facility] Care [facility]  [phone]

## 2017-08-12 ENCOUNTER — Other Ambulatory Visit: Payer: Self-pay | Admitting: *Deleted

## 2017-08-12 NOTE — Patient Outreach (Signed)
[facility]) Care Management  08/12/2017  Tim Rios [birth year] [id number]  Referral received from [name]'s health plan for transition of care services after his recent admission to [facility] on 07/24/17 for treatment of pneumonia. I spoke with [name] by phone on 07/27/17  to review details of his hospitalization and to assess for any transition of care needs. [name] agreed [facility] telephonic [facility] management services at that time and I scheduled a call to him on 08/06/17 after his PCP / post hospital follow up appointment. I was unable to reach [name] on 4/4 and again today when I called him at home. I left a HIPPA complaint voice message requesting a return call.  Plan: I will reach out to [name] again in 2 weeks.    Marja Kayslisa Gilboy MHA,BSN,RN,CCM [facility] Care Management  [phone]

## 2017-08-19 DIAGNOSIS — G729 Myopathy, unspecified: Secondary | ICD-10-CM | POA: Diagnosis not present

## 2017-08-19 DIAGNOSIS — R29898 Other symptoms and signs involving the musculoskeletal system: Secondary | ICD-10-CM | POA: Diagnosis not present

## 2017-08-19 DIAGNOSIS — M5020 Other cervical disc displacement, unspecified cervical region: Secondary | ICD-10-CM | POA: Diagnosis not present

## 2017-08-19 DIAGNOSIS — R748 Abnormal levels of other serum enzymes: Secondary | ICD-10-CM | POA: Diagnosis not present

## 2017-08-19 DIAGNOSIS — J189 Pneumonia, unspecified organism: Secondary | ICD-10-CM | POA: Diagnosis not present

## 2017-08-19 DIAGNOSIS — J181 Lobar pneumonia, unspecified organism: Secondary | ICD-10-CM | POA: Diagnosis not present

## 2017-08-19 DIAGNOSIS — G629 Polyneuropathy, unspecified: Secondary | ICD-10-CM | POA: Diagnosis not present

## 2017-08-24 DIAGNOSIS — R29898 Other symptoms and signs involving the musculoskeletal system: Secondary | ICD-10-CM | POA: Diagnosis not present

## 2017-08-26 ENCOUNTER — Other Ambulatory Visit: Payer: Self-pay | Admitting: *Deleted

## 2017-08-26 NOTE — Patient Outreach (Signed)
[facility]) Care Management  08/26/2017  Tim Rios [birth year] [id number]  Referral received from [name]'s health plan for transition of care services on 07/27/17 after his  admission to [facility] on 07/24/17 for treatment of pneumonia. I spoke with [name] by phone to review details of his hospitalization and to assess for any transition of care needs. I explained [facility] services to [name] and received permission to open his case for telephonic case management services. Unfortunately, I have been unable to reach [name] by phone since 07/27/17.   Plan: I will send a letter to the home of [name] outlining our services and providing contact information. If I do not hear from [name] over the next 10 day period, I will close his case. However, we will be pleased to assist [name] with any case management needs he may have in the future.    Marja Kayslisa Gilboy MHA,BSN,RN,CCM [facility] Care [facility]  [phone]

## 2017-09-03 DIAGNOSIS — M7021 Olecranon bursitis, right elbow: Secondary | ICD-10-CM | POA: Diagnosis not present

## 2017-09-03 DIAGNOSIS — J181 Lobar pneumonia, unspecified organism: Secondary | ICD-10-CM | POA: Diagnosis not present

## 2017-09-10 DIAGNOSIS — R29898 Other symptoms and signs involving the musculoskeletal system: Secondary | ICD-10-CM | POA: Diagnosis not present

## 2017-09-18 DIAGNOSIS — J189 Pneumonia, unspecified organism: Secondary | ICD-10-CM | POA: Diagnosis not present

## 2017-09-18 DIAGNOSIS — J181 Lobar pneumonia, unspecified organism: Secondary | ICD-10-CM | POA: Diagnosis not present

## 2017-10-01 ENCOUNTER — Other Ambulatory Visit: Payer: Self-pay | Admitting: *Deleted

## 2017-10-01 NOTE — Patient Outreach (Signed)
[facility] Care Management  10/01/2017  Tim Rios [birth year] [id number]  Referral received from [name]'s health plan for transition of care services on 07/27/17 after his  admission to [facility] ending on 3/22/19for treatment of pneumonia. I spoke with [name] by phone to review details of his hospitalization and to assess for any transition of care needs. I explained [facility] Care Management services to [name] and received permission to open his case for telephonic case management services. Unfortunately, I have been unable to reach [name] by phone or letter since 07/27/17.   Plan: I will close [name]'s case and notify his provider of the same.    Marja Kays MHA,BSN,RN,CCM [facility] Care Management  [phone]

## 2017-10-05 DIAGNOSIS — R29898 Other symptoms and signs involving the musculoskeletal system: Secondary | ICD-10-CM | POA: Diagnosis not present

## 2017-10-05 DIAGNOSIS — R531 Weakness: Secondary | ICD-10-CM | POA: Diagnosis not present

## 2017-10-06 DIAGNOSIS — I10 Essential (primary) hypertension: Secondary | ICD-10-CM | POA: Diagnosis not present

## 2017-10-06 DIAGNOSIS — Z0000 Encounter for general adult medical examination without abnormal findings: Secondary | ICD-10-CM | POA: Diagnosis not present

## 2017-10-06 DIAGNOSIS — E119 Type 2 diabetes mellitus without complications: Secondary | ICD-10-CM | POA: Diagnosis not present

## 2017-10-06 DIAGNOSIS — E7801 Familial hypercholesterolemia: Secondary | ICD-10-CM | POA: Diagnosis not present

## 2017-10-09 DIAGNOSIS — E1159 Type 2 diabetes mellitus with other circulatory complications: Secondary | ICD-10-CM | POA: Diagnosis not present

## 2017-10-09 DIAGNOSIS — I10 Essential (primary) hypertension: Secondary | ICD-10-CM | POA: Diagnosis not present

## 2017-10-09 DIAGNOSIS — I739 Peripheral vascular disease, unspecified: Secondary | ICD-10-CM | POA: Diagnosis not present

## 2017-10-09 DIAGNOSIS — E7801 Familial hypercholesterolemia: Secondary | ICD-10-CM | POA: Diagnosis not present

## 2017-10-12 DIAGNOSIS — J188 Other pneumonia, unspecified organism: Secondary | ICD-10-CM | POA: Diagnosis not present

## 2017-10-12 DIAGNOSIS — R0602 Shortness of breath: Secondary | ICD-10-CM | POA: Diagnosis not present

## 2017-10-12 DIAGNOSIS — R918 Other nonspecific abnormal finding of lung field: Secondary | ICD-10-CM | POA: Diagnosis not present

## 2017-10-20 DIAGNOSIS — R918 Other nonspecific abnormal finding of lung field: Secondary | ICD-10-CM | POA: Diagnosis not present

## 2017-10-21 DIAGNOSIS — R29898 Other symptoms and signs involving the musculoskeletal system: Secondary | ICD-10-CM | POA: Diagnosis not present

## 2017-10-26 DIAGNOSIS — R6889 Other general symptoms and signs: Secondary | ICD-10-CM | POA: Diagnosis not present

## 2017-10-26 DIAGNOSIS — R29898 Other symptoms and signs involving the musculoskeletal system: Secondary | ICD-10-CM | POA: Diagnosis not present

## 2017-10-27 DIAGNOSIS — R0602 Shortness of breath: Secondary | ICD-10-CM | POA: Diagnosis not present

## 2017-10-27 DIAGNOSIS — E119 Type 2 diabetes mellitus without complications: Secondary | ICD-10-CM | POA: Diagnosis not present

## 2017-10-27 DIAGNOSIS — I10 Essential (primary) hypertension: Secondary | ICD-10-CM | POA: Diagnosis not present

## 2017-10-27 DIAGNOSIS — R9389 Abnormal findings on diagnostic imaging of other specified body structures: Secondary | ICD-10-CM | POA: Diagnosis not present

## 2017-11-02 DIAGNOSIS — R29898 Other symptoms and signs involving the musculoskeletal system: Secondary | ICD-10-CM | POA: Diagnosis not present

## 2017-11-03 DIAGNOSIS — M6281 Muscle weakness (generalized): Secondary | ICD-10-CM | POA: Diagnosis not present

## 2017-11-03 DIAGNOSIS — R29898 Other symptoms and signs involving the musculoskeletal system: Secondary | ICD-10-CM | POA: Diagnosis not present

## 2017-11-06 DIAGNOSIS — E291 Testicular hypofunction: Secondary | ICD-10-CM | POA: Diagnosis not present

## 2017-11-12 DIAGNOSIS — R29898 Other symptoms and signs involving the musculoskeletal system: Secondary | ICD-10-CM | POA: Diagnosis not present

## 2017-11-12 DIAGNOSIS — M5020 Other cervical disc displacement, unspecified cervical region: Secondary | ICD-10-CM | POA: Diagnosis not present

## 2017-11-12 DIAGNOSIS — G729 Myopathy, unspecified: Secondary | ICD-10-CM | POA: Diagnosis not present

## 2017-11-20 DIAGNOSIS — M62512 Muscle wasting and atrophy, not elsewhere classified, left shoulder: Secondary | ICD-10-CM | POA: Diagnosis not present

## 2018-01-12 DIAGNOSIS — R29898 Other symptoms and signs involving the musculoskeletal system: Secondary | ICD-10-CM | POA: Diagnosis not present

## 2018-01-12 DIAGNOSIS — G7241 Inclusion body myositis [IBM]: Secondary | ICD-10-CM | POA: Diagnosis not present

## 2018-01-28 DIAGNOSIS — G7241 Inclusion body myositis [IBM]: Secondary | ICD-10-CM | POA: Diagnosis not present

## 2018-01-28 DIAGNOSIS — E7801 Familial hypercholesterolemia: Secondary | ICD-10-CM | POA: Diagnosis not present

## 2018-02-04 DIAGNOSIS — Z23 Encounter for immunization: Secondary | ICD-10-CM | POA: Diagnosis not present

## 2018-02-22 DIAGNOSIS — R29898 Other symptoms and signs involving the musculoskeletal system: Secondary | ICD-10-CM | POA: Diagnosis not present

## 2018-02-22 DIAGNOSIS — Z7984 Long term (current) use of oral hypoglycemic drugs: Secondary | ICD-10-CM | POA: Diagnosis not present

## 2018-02-22 DIAGNOSIS — E119 Type 2 diabetes mellitus without complications: Secondary | ICD-10-CM | POA: Diagnosis not present

## 2018-02-22 DIAGNOSIS — R253 Fasciculation: Secondary | ICD-10-CM | POA: Diagnosis not present

## 2018-02-22 DIAGNOSIS — G6289 Other specified polyneuropathies: Secondary | ICD-10-CM | POA: Diagnosis not present

## 2018-02-26 DIAGNOSIS — Z6828 Body mass index (BMI) 28.0-28.9, adult: Secondary | ICD-10-CM | POA: Diagnosis not present

## 2018-02-26 DIAGNOSIS — I10 Essential (primary) hypertension: Secondary | ICD-10-CM | POA: Diagnosis not present

## 2018-02-26 DIAGNOSIS — R0602 Shortness of breath: Secondary | ICD-10-CM | POA: Diagnosis not present

## 2018-03-08 DIAGNOSIS — M779 Enthesopathy, unspecified: Secondary | ICD-10-CM | POA: Diagnosis not present

## 2018-03-08 DIAGNOSIS — E1142 Type 2 diabetes mellitus with diabetic polyneuropathy: Secondary | ICD-10-CM | POA: Diagnosis not present

## 2018-03-08 DIAGNOSIS — E119 Type 2 diabetes mellitus without complications: Secondary | ICD-10-CM | POA: Diagnosis not present

## 2018-03-15 DIAGNOSIS — H31011 Macula scars of posterior pole (postinflammatory) (post-traumatic), right eye: Secondary | ICD-10-CM | POA: Diagnosis not present

## 2018-03-15 DIAGNOSIS — H52203 Unspecified astigmatism, bilateral: Secondary | ICD-10-CM | POA: Diagnosis not present

## 2018-03-15 DIAGNOSIS — H2513 Age-related nuclear cataract, bilateral: Secondary | ICD-10-CM | POA: Diagnosis not present

## 2018-03-15 DIAGNOSIS — H524 Presbyopia: Secondary | ICD-10-CM | POA: Diagnosis not present

## 2018-03-15 DIAGNOSIS — H5201 Hypermetropia, right eye: Secondary | ICD-10-CM | POA: Diagnosis not present

## 2018-03-15 DIAGNOSIS — Z7984 Long term (current) use of oral hypoglycemic drugs: Secondary | ICD-10-CM | POA: Diagnosis not present

## 2018-03-15 DIAGNOSIS — H25013 Cortical age-related cataract, bilateral: Secondary | ICD-10-CM | POA: Diagnosis not present

## 2018-03-15 DIAGNOSIS — H43813 Vitreous degeneration, bilateral: Secondary | ICD-10-CM | POA: Diagnosis not present

## 2018-03-15 DIAGNOSIS — E119 Type 2 diabetes mellitus without complications: Secondary | ICD-10-CM | POA: Diagnosis not present

## 2018-03-18 DIAGNOSIS — E291 Testicular hypofunction: Secondary | ICD-10-CM | POA: Diagnosis not present

## 2018-03-25 DIAGNOSIS — N529 Male erectile dysfunction, unspecified: Secondary | ICD-10-CM | POA: Diagnosis not present

## 2018-03-25 DIAGNOSIS — G5611 Other lesions of median nerve, right upper limb: Secondary | ICD-10-CM | POA: Diagnosis not present

## 2018-03-25 DIAGNOSIS — R29898 Other symptoms and signs involving the musculoskeletal system: Secondary | ICD-10-CM | POA: Diagnosis not present

## 2018-03-25 DIAGNOSIS — R292 Abnormal reflex: Secondary | ICD-10-CM | POA: Diagnosis not present

## 2018-03-25 DIAGNOSIS — E291 Testicular hypofunction: Secondary | ICD-10-CM | POA: Diagnosis not present

## 2018-03-25 DIAGNOSIS — R94131 Abnormal electromyogram [EMG]: Secondary | ICD-10-CM | POA: Diagnosis not present

## 2018-03-25 DIAGNOSIS — R531 Weakness: Secondary | ICD-10-CM | POA: Diagnosis not present

## 2018-04-05 DIAGNOSIS — E1159 Type 2 diabetes mellitus with other circulatory complications: Secondary | ICD-10-CM | POA: Diagnosis not present

## 2018-04-27 DIAGNOSIS — E291 Testicular hypofunction: Secondary | ICD-10-CM | POA: Diagnosis not present

## 2018-04-27 DIAGNOSIS — J3089 Other allergic rhinitis: Secondary | ICD-10-CM | POA: Diagnosis not present

## 2018-04-27 DIAGNOSIS — E7801 Familial hypercholesterolemia: Secondary | ICD-10-CM | POA: Diagnosis not present

## 2018-04-27 DIAGNOSIS — E1159 Type 2 diabetes mellitus with other circulatory complications: Secondary | ICD-10-CM | POA: Diagnosis not present

## 2018-04-27 DIAGNOSIS — K210 Gastro-esophageal reflux disease with esophagitis: Secondary | ICD-10-CM | POA: Diagnosis not present

## 2018-04-27 DIAGNOSIS — N529 Male erectile dysfunction, unspecified: Secondary | ICD-10-CM | POA: Diagnosis not present

## 2018-04-27 DIAGNOSIS — G7241 Inclusion body myositis [IBM]: Secondary | ICD-10-CM | POA: Diagnosis not present

## 2018-04-27 DIAGNOSIS — I739 Peripheral vascular disease, unspecified: Secondary | ICD-10-CM | POA: Diagnosis not present

## 2018-04-27 DIAGNOSIS — I10 Essential (primary) hypertension: Secondary | ICD-10-CM | POA: Diagnosis not present

## 2018-04-27 DIAGNOSIS — E559 Vitamin D deficiency, unspecified: Secondary | ICD-10-CM | POA: Diagnosis not present

## 2018-04-27 DIAGNOSIS — Z0000 Encounter for general adult medical examination without abnormal findings: Secondary | ICD-10-CM | POA: Diagnosis not present

## 2020-04-04 DEATH — deceased
